# Patient Record
Sex: Female | Born: 1990 | Hispanic: Yes | Marital: Single | Smoking: Never smoker
Health system: Southern US, Community
[De-identification: ages and names within clinical notes are randomized; demographics above are authoritative.]

## PROBLEM LIST (undated history)

## (undated) HISTORY — PX: TONSILLECTOMY: SUR1361

---

## 2017-08-05 ENCOUNTER — Other Ambulatory Visit: Payer: Self-pay

## 2017-08-05 ENCOUNTER — Encounter (HOSPITAL_BASED_OUTPATIENT_CLINIC_OR_DEPARTMENT_OTHER): Payer: Self-pay

## 2017-08-05 ENCOUNTER — Emergency Department (HOSPITAL_BASED_OUTPATIENT_CLINIC_OR_DEPARTMENT_OTHER)
Admission: EM | Admit: 2017-08-05 | Discharge: 2017-08-05 | Disposition: A | Discharge status: Home / Self Care | Payer: Self-pay | Attending: Physician Assistant | Admitting: Physician Assistant

## 2017-08-05 ENCOUNTER — Emergency Department (HOSPITAL_BASED_OUTPATIENT_CLINIC_OR_DEPARTMENT_OTHER): Payer: Self-pay

## 2017-08-05 DIAGNOSIS — K219 Gastro-esophageal reflux disease without esophagitis: Secondary | ICD-10-CM | POA: Insufficient documentation

## 2017-08-05 DIAGNOSIS — R059 Cough, unspecified: Secondary | ICD-10-CM

## 2017-08-05 DIAGNOSIS — R05 Cough: Secondary | ICD-10-CM | POA: Insufficient documentation

## 2017-08-05 MED ORDER — LORATADINE 10 MG PO TABS: 10.0000 mg | ORAL_TABLET | Freq: Every day | ORAL | 0 refills | Status: AC

## 2017-08-05 MED ORDER — FAMOTIDINE 20 MG PO TABS: 20.0000 mg | ORAL_TABLET | Freq: Two times a day (BID) | ORAL | 0 refills | Status: AC

## 2017-08-05 NOTE — ED Triage Notes (Signed)
C/o flu like sx x 3 weeks-NAD-steady gait 

## 2017-08-05 NOTE — ED Provider Notes (Signed)
[facility] EMERGENCY DEPARTMENT Provider Note   CSN: [id number] Arrival date & time: 08/05/17  1414     History   Chief Complaint Chief Complaint  Patient presents with  . Cough    HPI Anna Lynch is a 27 y.o. female.  Patient presents the emergency department with complaint of cough over the past 3 weeks.  The onset patient had more of a URI picture with nasal congestion and sore throat.  Unknown fevers.  She has had persistent sore throat and has had increased belching after eating and burning in her throat over the past 1 week.  She continues to cough and has coughing spells.  Posttussive emesis x1 today.  She denies any recent fevers.  She does have some nasal congestion and runny nose.  Throat is "scratchy".  No chest pain or shortness of breath.  No abdominal pain.  No urinary symptoms.  She has been using over-the-counter cough and cold medications without relief.  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 The onset of this condition was acute. The course is constant. Aggravating factors: none. Alleviating factors: none.        History reviewed. No pertinent past medical history.  There are no active problems to display for this patient.   Past Surgical History:  Procedure Laterality Date  . TONSILLECTOMY      OB History    No data available       Home Medications    Prior to Admission medications   Medication Sig Start Date End Date Taking? Authorizing Provider  famotidine (PEPCID) 20 MG tablet Take 1 tablet (20 mg total) by mouth 2 (two) times daily. 08/05/17   Renne Crigler, PA-C  loratadine (CLARITIN) 10 MG tablet Take 1 tablet (10 mg total) by mouth daily. 08/05/17   Renne Crigler, PA-C    Family History No family history on file.  Social History Social History   Tobacco Use  . Smoking status: Never Smoker  . Smokeless tobacco: Never Used  Substance Use Topics  . Alcohol use: No   Frequency: Never  . Drug use: No     Allergies   Patient has no known allergies.   Review of Systems Review of Systems  Constitutional: Negative for chills, fatigue and fever.  HENT: Positive for congestion, rhinorrhea and sore throat. Negative for ear pain and sinus pressure.   Eyes: Negative for redness.  Respiratory: Positive for cough. Negative for wheezing.   Gastrointestinal: Positive for vomiting. Negative for abdominal pain, diarrhea and nausea.  Genitourinary: Negative for dysuria.  Musculoskeletal: Negative for myalgias and neck stiffness.  Skin: Negative for rash.  Neurological: Negative for headaches.  Hematological: Negative for adenopathy.     Physical Exam Updated Vital Signs BP 136/77 (BP Location: Left Arm)   Pulse 64   Temp 98.3 F (36.8 C) (Oral)   Resp 16   Ht 5\' 2"  (1.575 m)   Wt 108.9 kg (240 lb 1.6 oz)   LMP 07/18/2017   SpO2 98%   BMI 43.91 kg/m   Physical Exam  Constitutional: She appears well-developed and well-nourished.  HENT:  Head: Normocephalic and atraumatic.  Right Ear: Tympanic membrane, external ear and ear canal normal.  Left Ear: Tympanic membrane, external ear and ear canal normal.  Nose: Mucosal edema present. No rhinorrhea.  Mouth/Throat: Uvula is midline, oropharynx is clear and moist and mucous membranes are normal. Mucous membranes are not dry. No  oral lesions. No trismus in the jaw. No uvula swelling. No oropharyngeal exudate, posterior oropharyngeal edema, posterior oropharyngeal erythema or tonsillar abscesses.  Eyes: Conjunctivae are normal. Right eye exhibits no discharge. Left eye exhibits no discharge.  Neck: Normal range of motion. Neck supple.  Cardiovascular: Normal rate, regular rhythm and normal heart sounds.  Pulmonary/Chest: Effort normal and breath sounds normal. No respiratory distress. She has no wheezes. She has no rales.  Abdominal: Soft. There is no tenderness. There is no rebound and no guarding.    Lymphadenopathy:    She has no cervical adenopathy.  Neurological: She is alert.  Skin: Skin is warm and dry.  Psychiatric: She has a normal mood and affect.  Nursing note and vitals reviewed.    ED Treatments / Results   Radiology Dg Chest 2 View  Result Date: 08/05/2017 CLINICAL DATA:  Cough and sore throat for 3 weeks. EXAM: CHEST  2 VIEW COMPARISON:  None. FINDINGS: Low lung volumes accentuate the heart size which is within normal limits. The mediastinal contours are within normal limits. Both lungs are clear. The visualized skeletal structures are unremarkable. IMPRESSION: No active cardiopulmonary disease. Electronically Signed   By: Elsie StainJohn T Curnes M.D.   On: 08/05/2017 17:10    Procedures Procedures (including critical care time)  Medications Ordered in ED Medications - No data to display   Initial Impression / Assessment and Plan / ED Course  I have reviewed the triage vital signs and the nursing notes.  Pertinent labs & imaging results that were available during my care of the patient were reviewed by me and considered in my medical decision making (see chart for details).     Patient seen and examined. Work-up initiated.  Given duration of symptoms, chest x-ray ordered.  If negative, will likely place on antihistamine and medicine for GERD.  Vital signs reviewed and are as follows: BP 136/77 (BP Location: Left Arm)   Pulse 64   Temp 98.3 F (36.8 C) (Oral)   Resp 16   Ht 5\' 2"  (1.575 m)   Wt 108.9 kg (240 lb 1.6 oz)   LMP 07/18/2017   SpO2 98%   BMI 43.91 kg/m   Chest x-ray negative, patient informed.  Will place on Claritin and Pepcid for the next 2 weeks.  If her symptoms persist, she should follow-up with her primary care doctor.  Discussed return to the emergency department the fever, chest pains, shortness of breath, new symptoms or other concerns.  Patient verbalizes understanding agrees with plan.  Final Clinical Impressions(s) / ED Diagnoses   Final  diagnoses:  Cough  Gastroesophageal reflux disease, esophagitis presence not specified   Patient with recent viral illness type picture with persistent cough.  Possible etiologies include cough after recent bronchitis, postnasal drip, GERD.  Treatment as above.  Patient has normal vital signs with no hypoxia or tachycardia.  No concern for PE.  Will treat empirically and have patient follow-up for further evaluation.  Return instructions as above.   ED Discharge Orders        Ordered    famotidine (PEPCID) 20 MG tablet  2 times daily     08/05/17 1744    loratadine (CLARITIN) 10 MG tablet  Daily     08/05/17 1744       Renne CriglerGeiple, Anthonia Monger, PA-C 08/05/17 1755    Mackuen, Cindee Saltourteney Lyn, MD 08/06/17 1500

## 2017-08-05 NOTE — Discharge Instructions (Signed)
Please read and follow all provided instructions.  Your diagnoses today include:  1. Cough   2. Gastroesophageal reflux disease, esophagitis presence not specified     Tests performed today include:  Chest x-ray - does not show any pneumonia  Vital signs. See below for your results today.   Medications prescribed:   Claritin - allergy medication for nasal drainage   Pepcid (famotidine) - antihistamine  You can find this medication over-the-counter.   DO NOT exceed:   20mg  Pepcid every 12 hours  Take any prescribed medications only as directed.  Home care instructions:  Follow any educational materials contained in this packet.  Follow-up instructions: Please follow-up with your primary care provider in the next 2 weeks for further evaluation of your symptoms and a recheck if you are not feeling better.   Return instructions:   Please return to the Emergency Department if you experience worsening symptoms.  Please return with worsening wheezing, shortness of breath, or difficulty breathing.  Return with persistent fever above 101F.   Please return if you have any other emergent concerns.  Additional Information:  Your vital signs today were: BP 136/77 (BP Location: Left Arm)    Pulse 64    Temp 98.3 F (36.8 C) (Oral)    Resp 16    Ht 5\' 2"  (1.575 m)    Wt 108.9 kg (240 lb 1.6 oz)    LMP 07/18/2017    SpO2 98%    BMI 43.91 kg/m  If your blood pressure (BP) was elevated above 135/85 this visit, please have this repeated by your doctor within one month. --------------

## 2019-02-07 IMAGING — CR DG CHEST 2V
2 series · 2 of 2 positions shown · non-contrast
Comparison: None.

CLINICAL DATA: Cough and sore throat for 3 weeks.

EXAM:
CHEST  2 VIEW

[w chest pa]
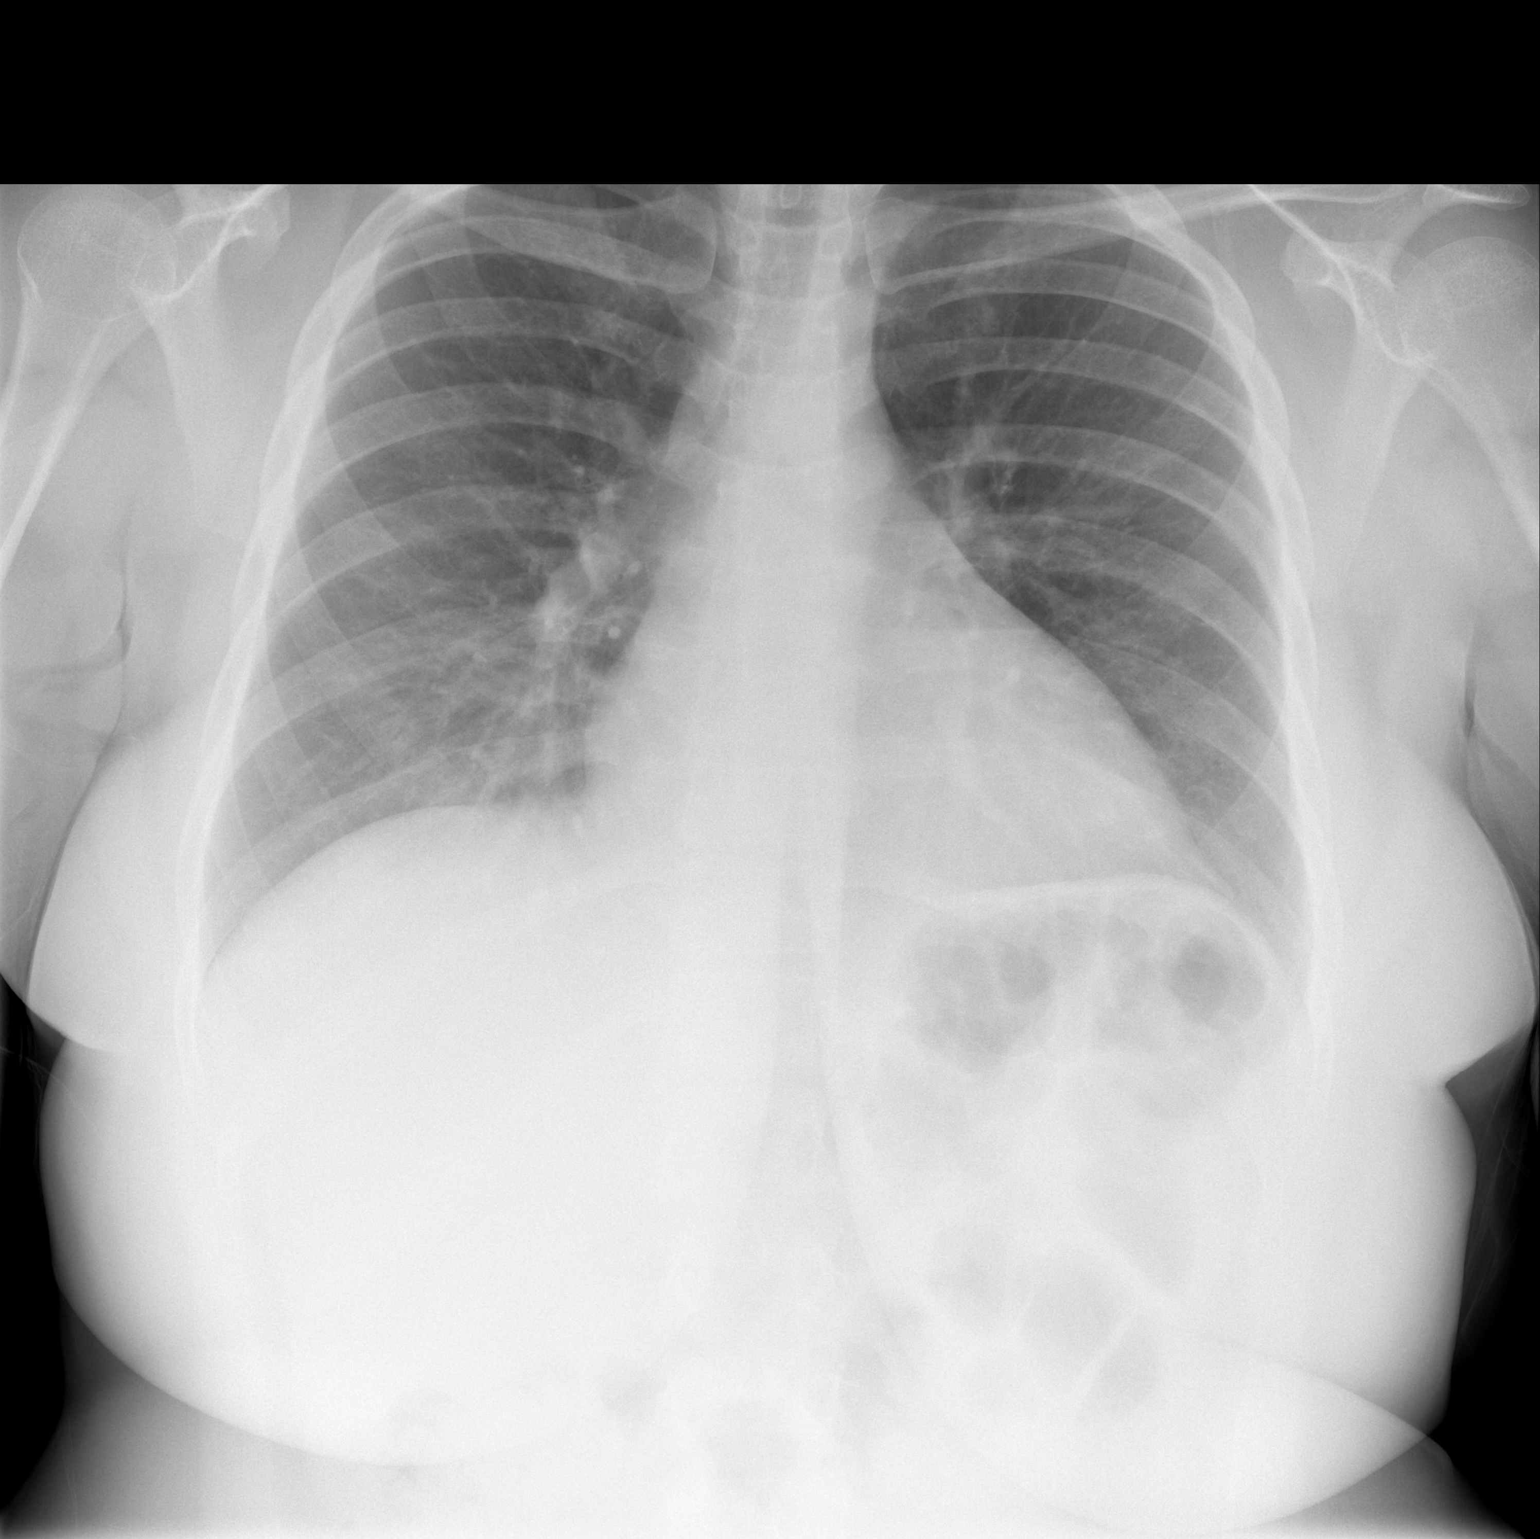

[w chest lat]
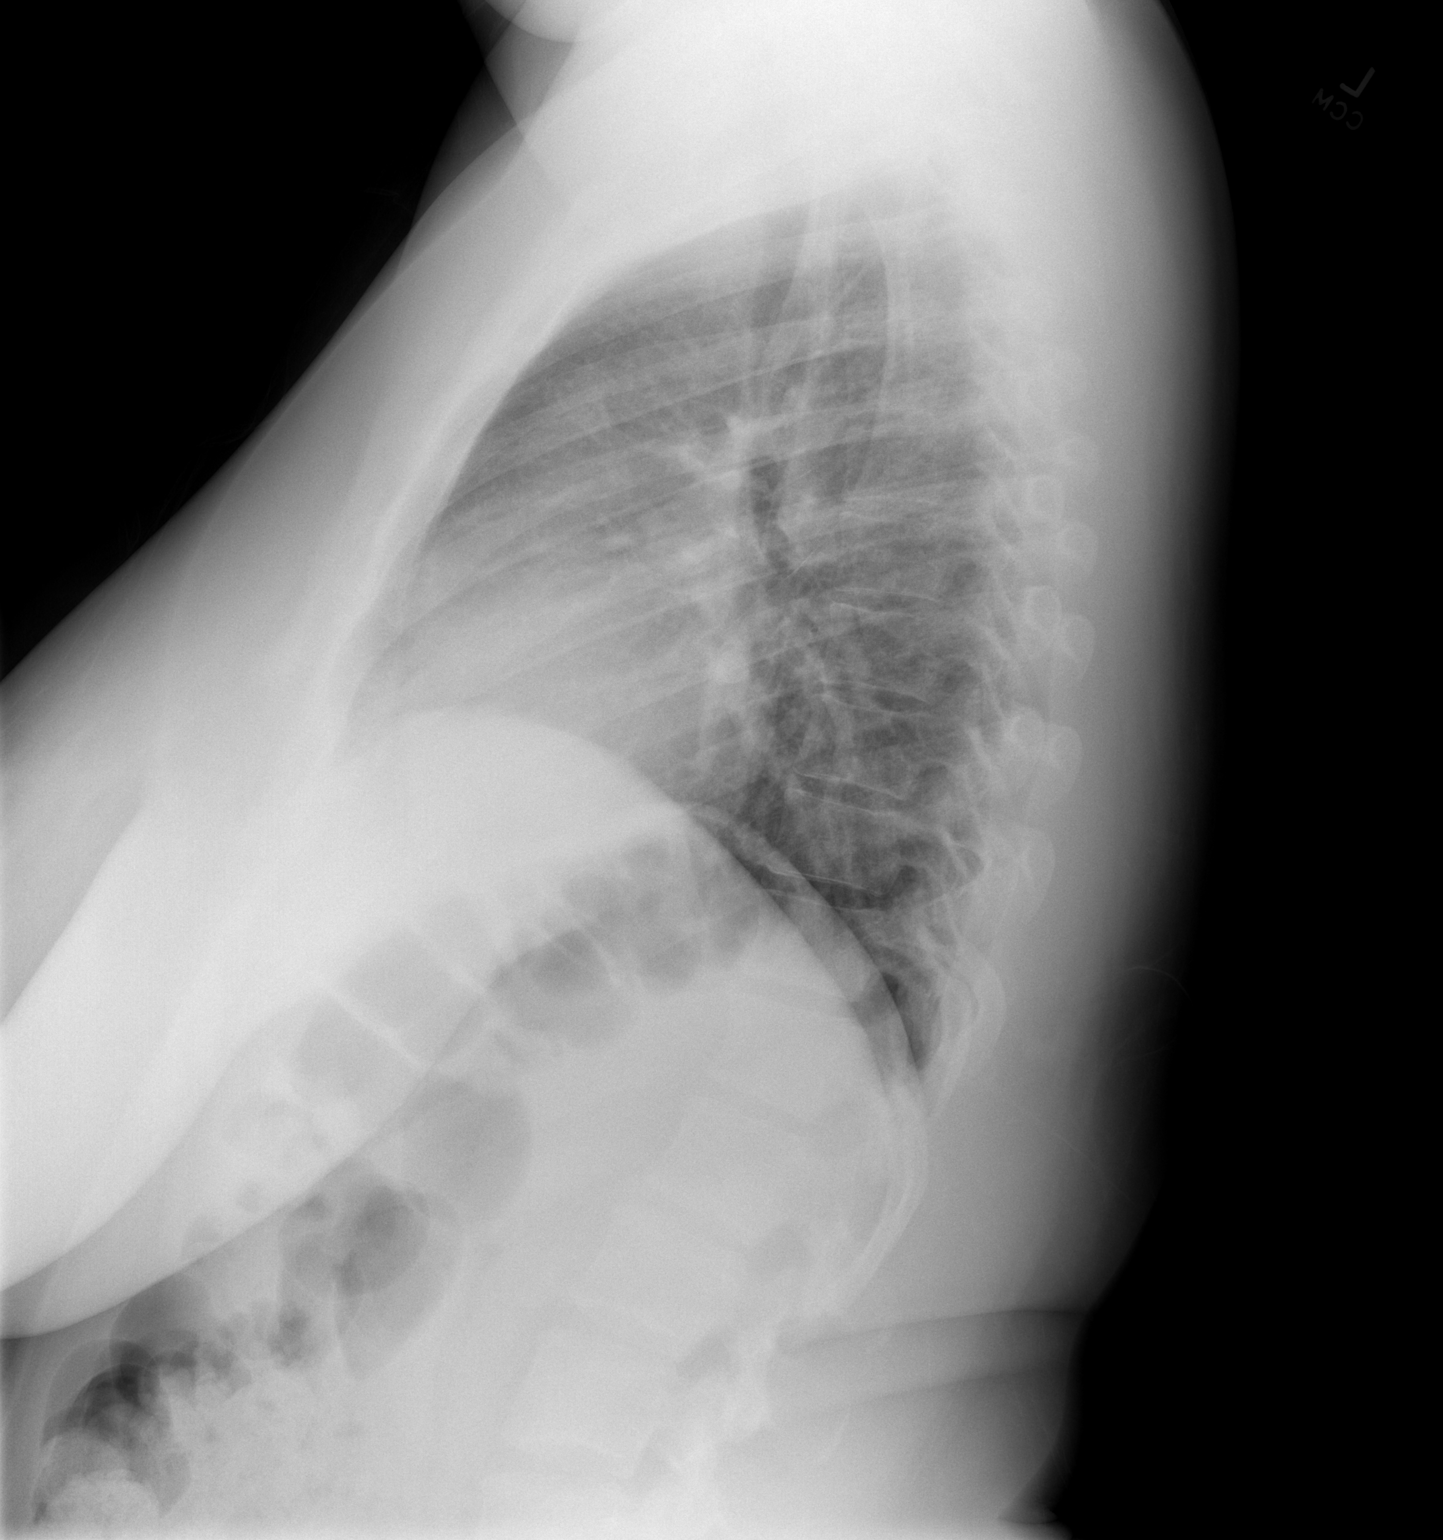

[2 of 2 positions shown; findings below may reference images not displayed]

FINDINGS: Low lung volumes accentuate the heart size which is within normal
limits. The mediastinal contours are within normal limits. Both
lungs are clear. The visualized skeletal structures are
unremarkable.
IMPRESSION: No active cardiopulmonary disease.
